# Patient Record
Sex: Male | Born: 1980 | Race: White | Hispanic: No | Marital: Single | Smoking: Current every day smoker
Health system: Southern US, Community
[De-identification: ages and names within clinical notes are randomized; demographics above are authoritative.]

---
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
## 2003-10-01 ENCOUNTER — Inpatient Hospital Stay (HOSPITAL_COMMUNITY): Admission: EM | Admit: 2003-10-01 | Discharge: 2003-10-03 | Payer: Self-pay | Admitting: Emergency Medicine

## 2004-11-16 ENCOUNTER — Emergency Department (HOSPITAL_COMMUNITY): Admission: EM | Admit: 2004-11-16 | Discharge: 2004-11-16 | Payer: Self-pay | Admitting: Emergency Medicine

## 2004-11-29 ENCOUNTER — Emergency Department (HOSPITAL_COMMUNITY): Admission: EM | Admit: 2004-11-29 | Discharge: 2004-11-29 | Payer: Self-pay | Admitting: Emergency Medicine

## 2005-01-04 ENCOUNTER — Emergency Department (HOSPITAL_COMMUNITY): Admission: EM | Admit: 2005-01-04 | Discharge: 2005-01-04 | Payer: Self-pay | Admitting: Emergency Medicine

## 2005-02-24 ENCOUNTER — Emergency Department (HOSPITAL_COMMUNITY): Admission: EM | Admit: 2005-02-24 | Discharge: 2005-02-24 | Payer: Self-pay | Admitting: Emergency Medicine

## 2006-10-17 ENCOUNTER — Emergency Department (HOSPITAL_COMMUNITY): Admission: EM | Admit: 2006-10-17 | Discharge: 2006-10-17 | Payer: Self-pay | Admitting: Emergency Medicine

## 2006-11-05 ENCOUNTER — Emergency Department (HOSPITAL_COMMUNITY): Admission: EM | Admit: 2006-11-05 | Discharge: 2006-11-05 | Payer: Self-pay | Admitting: Emergency Medicine

## 2007-01-30 ENCOUNTER — Emergency Department (HOSPITAL_COMMUNITY): Admission: EM | Admit: 2007-01-30 | Discharge: 2007-01-30 | Payer: Self-pay | Admitting: Emergency Medicine

## 2007-04-22 ENCOUNTER — Emergency Department (HOSPITAL_COMMUNITY): Admission: EM | Admit: 2007-04-22 | Discharge: 2007-04-22 | Payer: Self-pay | Admitting: Emergency Medicine

## 2007-08-05 ENCOUNTER — Emergency Department (HOSPITAL_COMMUNITY): Admission: EM | Admit: 2007-08-05 | Discharge: 2007-08-05 | Payer: Self-pay | Admitting: Emergency Medicine

## 2008-01-05 ENCOUNTER — Emergency Department (HOSPITAL_COMMUNITY): Admission: EM | Admit: 2008-01-05 | Discharge: 2008-01-05 | Payer: Self-pay | Admitting: Emergency Medicine

## 2008-04-14 ENCOUNTER — Emergency Department (HOSPITAL_COMMUNITY): Admission: EM | Admit: 2008-04-14 | Discharge: 2008-04-14 | Payer: Self-pay | Admitting: Emergency Medicine

## 2008-10-11 ENCOUNTER — Emergency Department (HOSPITAL_COMMUNITY): Admission: EM | Admit: 2008-10-11 | Discharge: 2008-10-11 | Payer: Self-pay | Admitting: Emergency Medicine
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
## 2011-01-29 LAB — DIFFERENTIAL
Basophils Absolute: 0.1
Eosinophils Relative: 1
Lymphocytes Relative: 27
Lymphs Abs: 3
Monocytes Absolute: 0.8
Monocytes Relative: 7
Neutrophils Relative %: 64

## 2011-01-29 LAB — CBC
HCT: 47
Hemoglobin: 15.9
MCHC: 33.9
Platelets: 335
RDW: 13

## 2011-02-09 LAB — DIFFERENTIAL
Basophils Absolute: 0 10*3/uL (ref 0.0–0.1)
Eosinophils Absolute: 0 10*3/uL (ref 0.0–0.7)
Eosinophils Relative: 0 % (ref 0–5)
Lymphocytes Relative: 10 % — ABNORMAL LOW (ref 12–46)
Lymphs Abs: 1.3 10*3/uL (ref 0.7–4.0)
Monocytes Absolute: 1.1 10*3/uL — ABNORMAL HIGH (ref 0.1–1.0)
Monocytes Relative: 8 % (ref 3–12)
Neutro Abs: 11.5 10*3/uL — ABNORMAL HIGH (ref 1.7–7.7)

## 2011-02-09 LAB — CBC
Hemoglobin: 16.2 g/dL (ref 13.0–17.0)
MCV: 90.4 fL (ref 78.0–100.0)
RBC: 5.41 MIL/uL (ref 4.22–5.81)
WBC: 13.9 10*3/uL — ABNORMAL HIGH (ref 4.0–10.5)

## 2011-02-09 LAB — URINALYSIS, ROUTINE W REFLEX MICROSCOPIC
Bilirubin Urine: NEGATIVE
Hgb urine dipstick: NEGATIVE
Nitrite: NEGATIVE
Specific Gravity, Urine: 1.025 (ref 1.005–1.030)
pH: 6 (ref 5.0–8.0)

## 2011-02-09 LAB — HEPATIC FUNCTION PANEL: Indirect Bilirubin: 0.8 mg/dL (ref 0.3–0.9)

## 2011-02-09 LAB — BASIC METABOLIC PANEL
Calcium: 9.4 mg/dL (ref 8.4–10.5)
GFR calc non Af Amer: >60 mL/min (ref >60)
Glucose, Bld: 83 mg/dL (ref 70–99)
Potassium: 4.6 mEq/L (ref 3.5–5.1)

## 2011-02-15 LAB — I-STAT 8, (EC8 V) (CONVERTED LAB)
Bicarbonate: 26.2 — ABNORMAL HIGH
Chloride: 102
HCT: 51
Hemoglobin: 17.3 — ABNORMAL HIGH
Operator id: 277751
Potassium: 3.8
Sodium: 139
pCO2, Ven: 44.1 — ABNORMAL LOW

## 2011-02-15 LAB — URINALYSIS, ROUTINE W REFLEX MICROSCOPIC
Glucose, UA: NEGATIVE
Specific Gravity, Urine: 1.025
Urobilinogen, UA: 0.2
pH: 6

## 2011-02-15 LAB — POCT I-STAT CREATININE
Creatinine, Ser: 1.2
Operator id: 277751

## 2015-10-18 ENCOUNTER — Emergency Department (HOSPITAL_COMMUNITY)
Admission: EM | Admit: 2015-10-18 | Discharge: 2015-10-19 | Disposition: A | Discharge status: Home / Self Care | Payer: Self-pay | Attending: Emergency Medicine | Admitting: Emergency Medicine

## 2015-10-18 DIAGNOSIS — S61305A Unspecified open wound of left ring finger with damage to nail, initial encounter: Secondary | ICD-10-CM | POA: Insufficient documentation

## 2015-10-18 DIAGNOSIS — Y990 Civilian activity done for income or pay: Secondary | ICD-10-CM | POA: Insufficient documentation

## 2015-10-18 DIAGNOSIS — W231XXA Caught, crushed, jammed, or pinched between stationary objects, initial encounter: Secondary | ICD-10-CM | POA: Insufficient documentation

## 2015-10-18 DIAGNOSIS — S61309A Unspecified open wound of unspecified finger with damage to nail, initial encounter: Secondary | ICD-10-CM

## 2015-10-18 DIAGNOSIS — Y9389 Activity, other specified: Secondary | ICD-10-CM | POA: Insufficient documentation

## 2015-10-18 DIAGNOSIS — Y929 Unspecified place or not applicable: Secondary | ICD-10-CM | POA: Insufficient documentation

## 2015-10-18 MED ORDER — BUPIVACAINE HCL (PF) 0.25 % IJ SOLN
10.0000 mL | Freq: Once | INTRAMUSCULAR | Status: AC
  Administered 2015-10-18: 10 mL
  Filled 2015-10-18: qty 30

## 2015-10-18 MED ORDER — TETANUS-DIPHTH-ACELL PERTUSSIS 5-2.5-18.5 LF-MCG/0.5 IM SUSP
0.5000 mL | Freq: Once | INTRAMUSCULAR | Status: AC
  Administered 2015-10-18: 0.5 mL via INTRAMUSCULAR
  Filled 2015-10-18: qty 0.5

## 2015-10-18 NOTE — ED Notes (Signed)
Pt injured his left ring finger last night working on a motorcycle
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
## 2015-10-19 MED ORDER — CEPHALEXIN 500 MG PO CAPS: 500.0000 mg | ORAL_CAPSULE | Freq: Four times a day (QID) | ORAL | Status: DC

## 2015-10-19 NOTE — Discharge Instructions (Signed)
Fingernail or Toenail Removal  Fingernail or toenail removal is a surgical procedure to take off a nail from your finger or your toe. You may need to have a fingernail or toenail removed if it has an abnormal shape (deformity) or if it is severely injured. A fingernail or toenail may also be removed due to a bacterial infection, a severe ingrown toenail, or a fungal infection that has failed treatment with antifungal medicines.  LET YOUR HEALTH CARE PROVIDER KNOW ABOUT:   Any allergies you have.   All medicines you are taking, including vitamins, herbs, eye drops, creams, and over-the-counter medicines.   Previous problems you or members of your family have had with the use of anesthetics.   Any blood disorders you have.   Previous surgeries you have had.   Any medical conditions you may have.  RISKS AND COMPLICATIONS  Generally, this is a safe procedure. However, problems may occur, including:   Pain.   Bleeding.   Infection.   Regrowth of a deformed nail.  BEFORE THE PROCEDURE   Ask your health care provider about changing or stopping your regular medicines. This is especially important if you are taking diabetes medicines or blood thinners.   Follow instructions from your health care provider about eating or drinking restrictions.   Plan to have someone take you home after the procedure.  PROCEDURE   An IV tube will be inserted into one of your veins.   You will be given one or more of the following:    A medicine that helps you relax (sedative).    A medicine that numbs the area (local anesthetic).   After your toe or finger is numb, your health care provider will insert a blunt instrument under your nail to lift it up.   In some cases, your health care provider may also make a cut (incision) in your nail.   After your nail is lifted away from your toe or finger, your health care provider will detach it from your nail bed.   A germ-killing bandage (antiseptic dressing) will be put on your toe  or finger.  The procedure may vary among health care providers and hospitals.  AFTER THE PROCEDURE   Your blood pressure, heart rate, breathing rate, and blood oxygen level will be monitored often until the medicines you were given have worn off.   It is common to have some pain after nail removal. You will be given pain medicine as needed.   You may be given a prescription for pain medicine and antibiotic medicine.   If you had a toenail removed, you will be given a surgical shoe to wear while you recover.   If you had a fingernail removed, you may be given a finger splint to wear while you recover.     This information is not intended to replace advice given to you by your health care provider. Make sure you discuss any questions you have with your health care provider.     Document Released: 01/20/2003 Document Revised: 09/07/2014 Document Reviewed: 04/21/2014  Elsevier Interactive Patient Education 2016 [employer]

## 2017-05-22 ENCOUNTER — Encounter (HOSPITAL_COMMUNITY): Payer: Self-pay | Admitting: *Deleted

## 2017-05-22 ENCOUNTER — Other Ambulatory Visit: Payer: Self-pay

## 2017-05-22 ENCOUNTER — Emergency Department (HOSPITAL_COMMUNITY): Payer: Self-pay

## 2017-05-22 ENCOUNTER — Emergency Department (HOSPITAL_COMMUNITY)
Admission: EM | Admit: 2017-05-22 | Discharge: 2017-05-22 | Disposition: A | Discharge status: Home / Self Care | Payer: Self-pay | Attending: Emergency Medicine | Admitting: Emergency Medicine

## 2017-05-22 DIAGNOSIS — Z79899 Other long term (current) drug therapy: Secondary | ICD-10-CM | POA: Insufficient documentation

## 2017-05-22 DIAGNOSIS — R002 Palpitations: Secondary | ICD-10-CM | POA: Insufficient documentation

## 2017-05-22 DIAGNOSIS — F17210 Nicotine dependence, cigarettes, uncomplicated: Secondary | ICD-10-CM | POA: Insufficient documentation

## 2017-05-22 LAB — BASIC METABOLIC PANEL
ANION GAP: 12 (ref 5–15)
BUN: <5 mg/dL — ABNORMAL LOW (ref 6–20)
CO2: 21 mmol/L — AB (ref 22–32)
CREATININE: 1.01 mg/dL (ref 0.61–1.24)
Calcium: 9.2 mg/dL (ref 8.9–10.3)
Chloride: 101 mmol/L (ref 101–111)
GLUCOSE: 94 mg/dL (ref 65–99)
Potassium: 3.3 mmol/L — ABNORMAL LOW (ref 3.5–5.1)
Sodium: 134 mmol/L — ABNORMAL LOW (ref 135–145)

## 2017-05-22 LAB — CBC
HCT: 49.4 % (ref 39.0–52.0)
HEMOGLOBIN: 17 g/dL (ref 13.0–17.0)
MCH: 31.8 pg (ref 26.0–34.0)
MCHC: 34.4 g/dL (ref 30.0–36.0)
MCV: 92.5 fL (ref 78.0–100.0)
Platelets: 354 10*3/uL (ref 150–400)
RBC: 5.34 MIL/uL (ref 4.22–5.81)
RDW: 14.5 % (ref 11.5–15.5)
WBC: 8.6 10*3/uL (ref 4.0–10.5)

## 2017-05-22 LAB — I-STAT TROPONIN, ED
TROPONIN I, POC: 0 ng/mL (ref 0.00–0.08)
Troponin i, poc: 0 ng/mL (ref 0.00–0.08)

## 2017-05-22 NOTE — Discharge Instructions (Signed)
Make an appointment to follow-up with cardiology.  Return for any new or worse symptoms or if the heart is in palpitations for more than an hour.  Today's workup without any acute findings.

## 2017-05-22 NOTE — ED Triage Notes (Signed)
Pt reports onset last night of intermittent palpitations with sob. Reports hx of same but never had a diagnosis made and does not take any meds for it.

## 2017-05-22 NOTE — ED Provider Notes (Signed)
[facility] Provider Note   CSN: [id number] Arrival date & time: 05/22/17  [id number]     History   Chief Complaint Chief Complaint  Patient presents with  . Palpitations    HPI John Mata is a 37 y.o. male.  Patient presents with palpitations heart pounding that started at 4 in the morning.  Patient had a history of palpitations in the past and at one time was monitored without any significant findings.  Associated with shortness of breath and a tightness feeling in the chest.  Patient now feels fine.      History reviewed. No pertinent past medical history.  There are no active problems to display for this patient.   History reviewed. No pertinent surgical history.     Home Medications    Prior to Admission medications   Medication Sig Start Date End Date Taking? Authorizing Provider  fexofenadine (ALLEGRA) 180 MG tablet Take 180 mg by mouth daily as needed for allergies or rhinitis.   Yes [provider]  Phenyleph-Doxylamine-DM-APAP (NYQUIL SEVERE COLD/FLU) 5-6.25-10-325 MG/15ML LIQD Take 15 mLs by mouth as needed (congestion).   Yes [provider]  Phenylephrine-APAP-Guaifenesin (MUCINEX FAST-MAX PO) Take 1 tablet by mouth as needed (congestion).   Yes [provider]  Pseudoephedrine-APAP-DM (DAYQUIL PO) Take 2 tablets by mouth as needed (cold symptoms).   Yes [provider]    Family History History reviewed. No pertinent family history.  Social History Social History   Tobacco Use  . Smoking status: Current Every Day Smoker  Substance Use Topics  . Alcohol use: No    Frequency: Never  . Drug use: No     Allergies   Patient has no known allergies.   Review of Systems Review of Systems  Constitutional: Negative for fever.  HENT: Negative for congestion.   Eyes: Negative for visual disturbance.  Respiratory: Negative for shortness of breath.   Cardiovascular:  Positive for chest pain and palpitations. Negative for leg swelling.  Gastrointestinal: Negative for abdominal pain.  Musculoskeletal: Negative for back pain.  Skin: Negative for rash.  Neurological: Negative for headaches.  Hematological: Does not bruise/bleed easily.  Psychiatric/Behavioral: Negative for confusion.     Physical Exam Updated Vital Signs BP (!) 118/106   Pulse 73   Temp 97.9 F (36.6 C) (Oral)   Resp 20   SpO2 95%   Physical Exam  Constitutional: He is oriented to person, place, and time. He appears well-developed and well-nourished. No distress.  HENT:  Head: Normocephalic and atraumatic.  Mouth/Throat: Oropharynx is clear and moist.  Eyes: EOM are normal. Pupils are equal, round, and reactive to light.  Neck: Normal range of motion. Neck supple.  Cardiovascular: Normal rate, regular rhythm and normal heart sounds.  Pulmonary/Chest: Effort normal and breath sounds normal. He exhibits no tenderness.  Abdominal: Soft. Bowel sounds are normal. There is no tenderness.  Musculoskeletal: Normal range of motion. He exhibits no edema.  Neurological: He is alert and oriented to person, place, and time. No cranial nerve deficit or sensory deficit. He exhibits normal muscle tone. Coordination normal.  Skin: Skin is warm.  Nursing note and vitals reviewed.    ED Treatments / Results  Labs (all labs ordered are listed, but only abnormal results are displayed) Labs Reviewed  BASIC METABOLIC PANEL - Abnormal; Notable for the following components:      Result Value   Sodium 134 (*)    Potassium 3.3 (*)  CO2 21 (*)    BUN <5 (*)    All other components within normal limits  CBC  I-STAT TROPONIN, ED  I-STAT TROPONIN, ED    EKG  EKG Interpretation  Date/Time:  Wednesday May 22 2017 08:18:28 EST Ventricular Rate:  84 PR Interval:  156 QRS Duration: 96 QT Interval:  356 QTC Calculation: 420 R Axis:   77 Text Interpretation:  Normal sinus rhythm Normal  ECG Interpretation limited secondary to artifact Confirmed by Vanetta Mulders [phone]) on 05/22/2017 9:16:05 AM       Radiology Dg Chest 2 View  Result Date: 05/22/2017 CLINICAL DATA:  Tachycardia, palpitations, discomfort since last night, some shortness of breath, visual impairment when other symptoms occur, diaphoresis, history smoking EXAM: CHEST  2 VIEW COMPARISON:  None FINDINGS: Normal heart size, mediastinal contours, and pulmonary vascularity. Lungs clear. No pleural effusion or pneumothorax. Bones unremarkable. IMPRESSION: Normal exam. Electronically Signed   By: Ulyses Southward M.D.   On: 05/22/2017 09:09    Procedures Procedures (including critical care time)  Medications Ordered in ED Medications - No data to display   Initial Impression / Assessment and Plan / ED Course  I have reviewed the triage vital signs and the nursing notes.  Pertinent labs & imaging results that were available during my care of the patient were reviewed by me and considered in my medical decision making (see chart for details).     No evidence of any arrhythmias on heart monitoring here.  EKG without any acute findings.  Troponins x2 were negative.  These were done because of the tightness in the chest.  Patient really without any concerns for pulmonary embolus.  No shortness of breath.  Not tachycardic.  Oxygen saturations in the upper 90s.  Patient will be referred to cardiology for further evaluation they may want to do cardiac monitoring again.  Patient will return for any new or worse symptoms or any recurrent palpitations lasting 40 minutes or longer.  Final Clinical Impressions(s) / ED Diagnoses   Final diagnoses:  Palpitations    ED Discharge Orders    None       Vanetta Mulders, MD 05/22/17 [phone]

## 2019-04-27 IMAGING — DX DG CHEST 2V
2 series · 2 of 2 positions shown · non-contrast
Comparison: None

CLINICAL DATA: Tachycardia, palpitations, discomfort since last
night, some shortness of breath, visual impairment when other
symptoms occur, diaphoresis, history smoking

EXAM:
CHEST  2 VIEW

[chest pa]
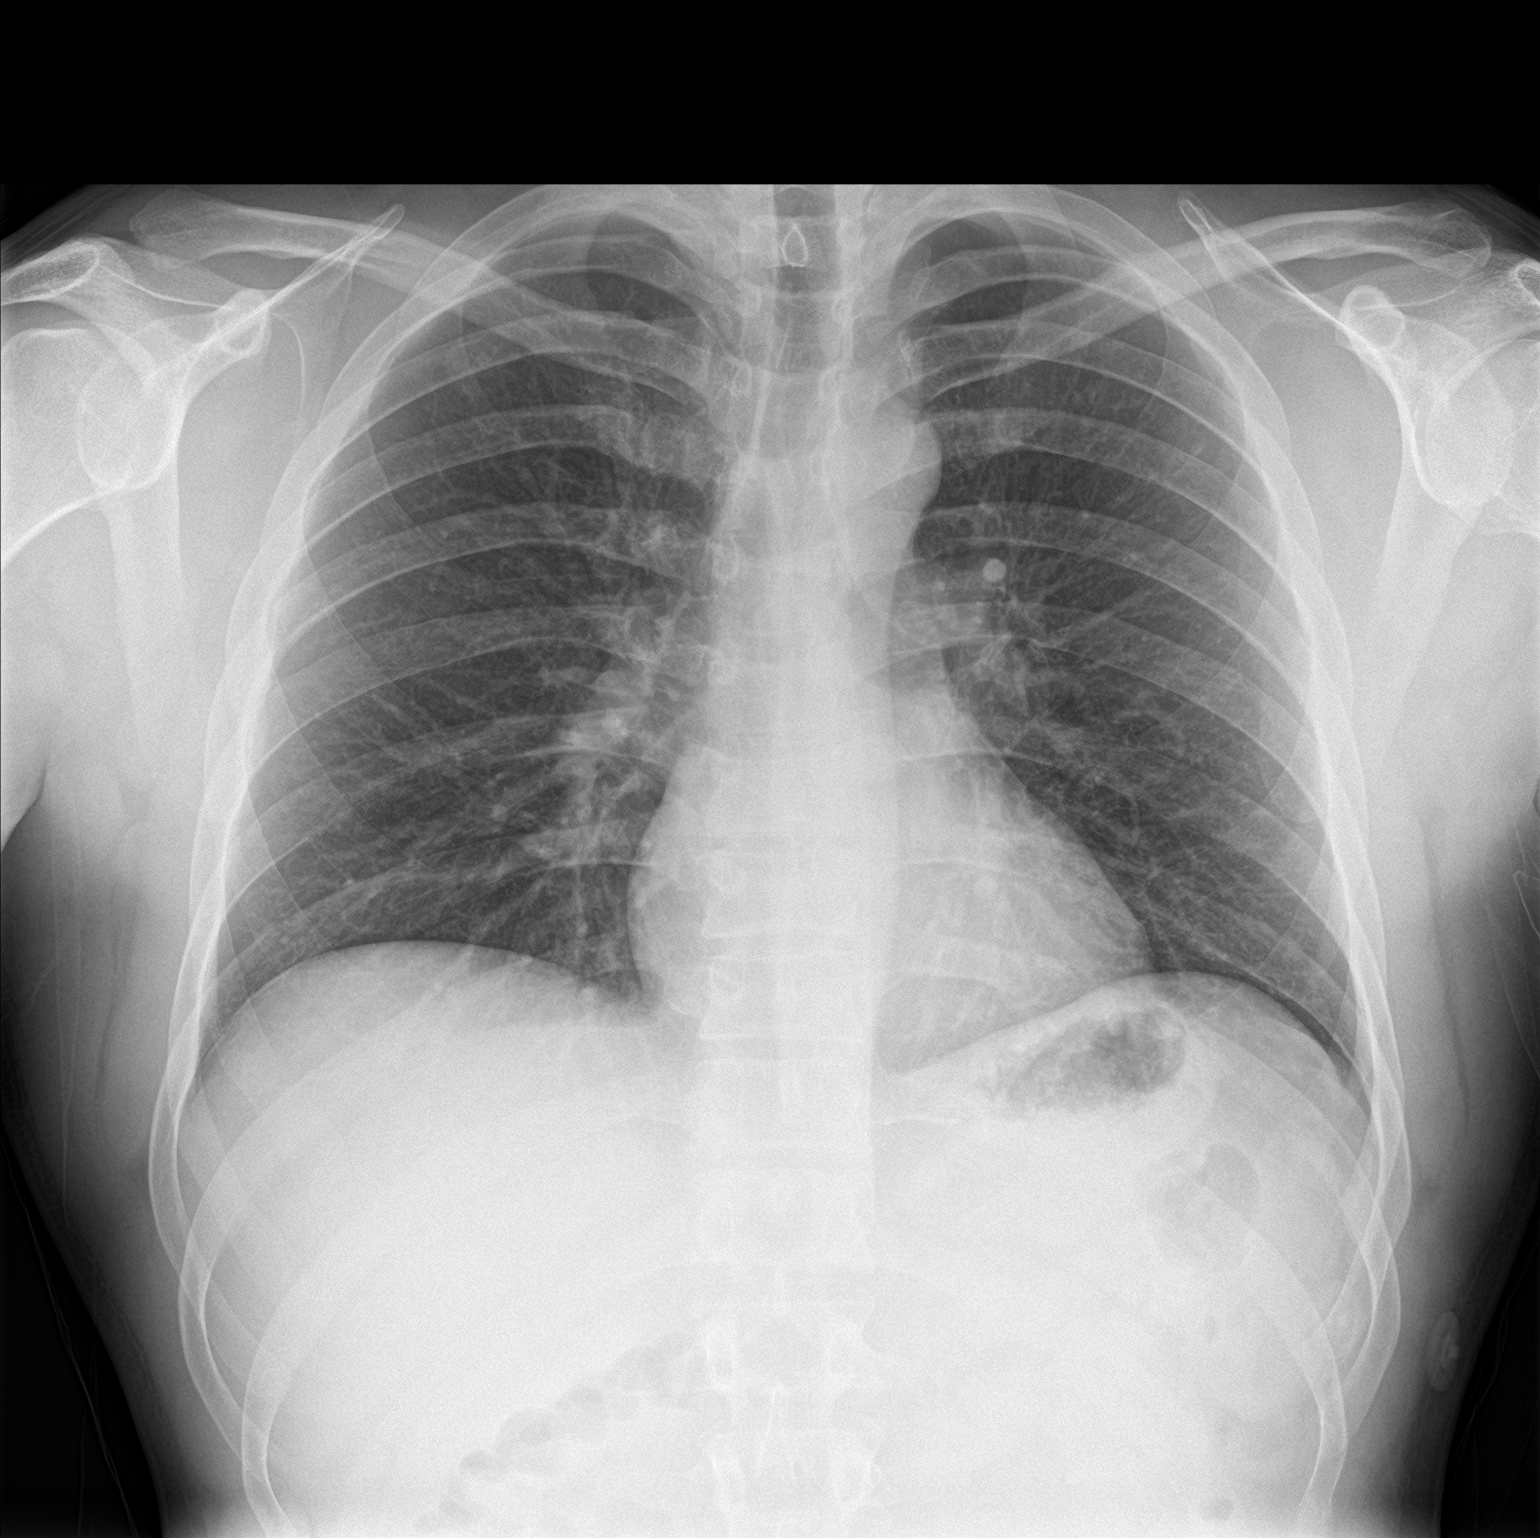

[chest lat]
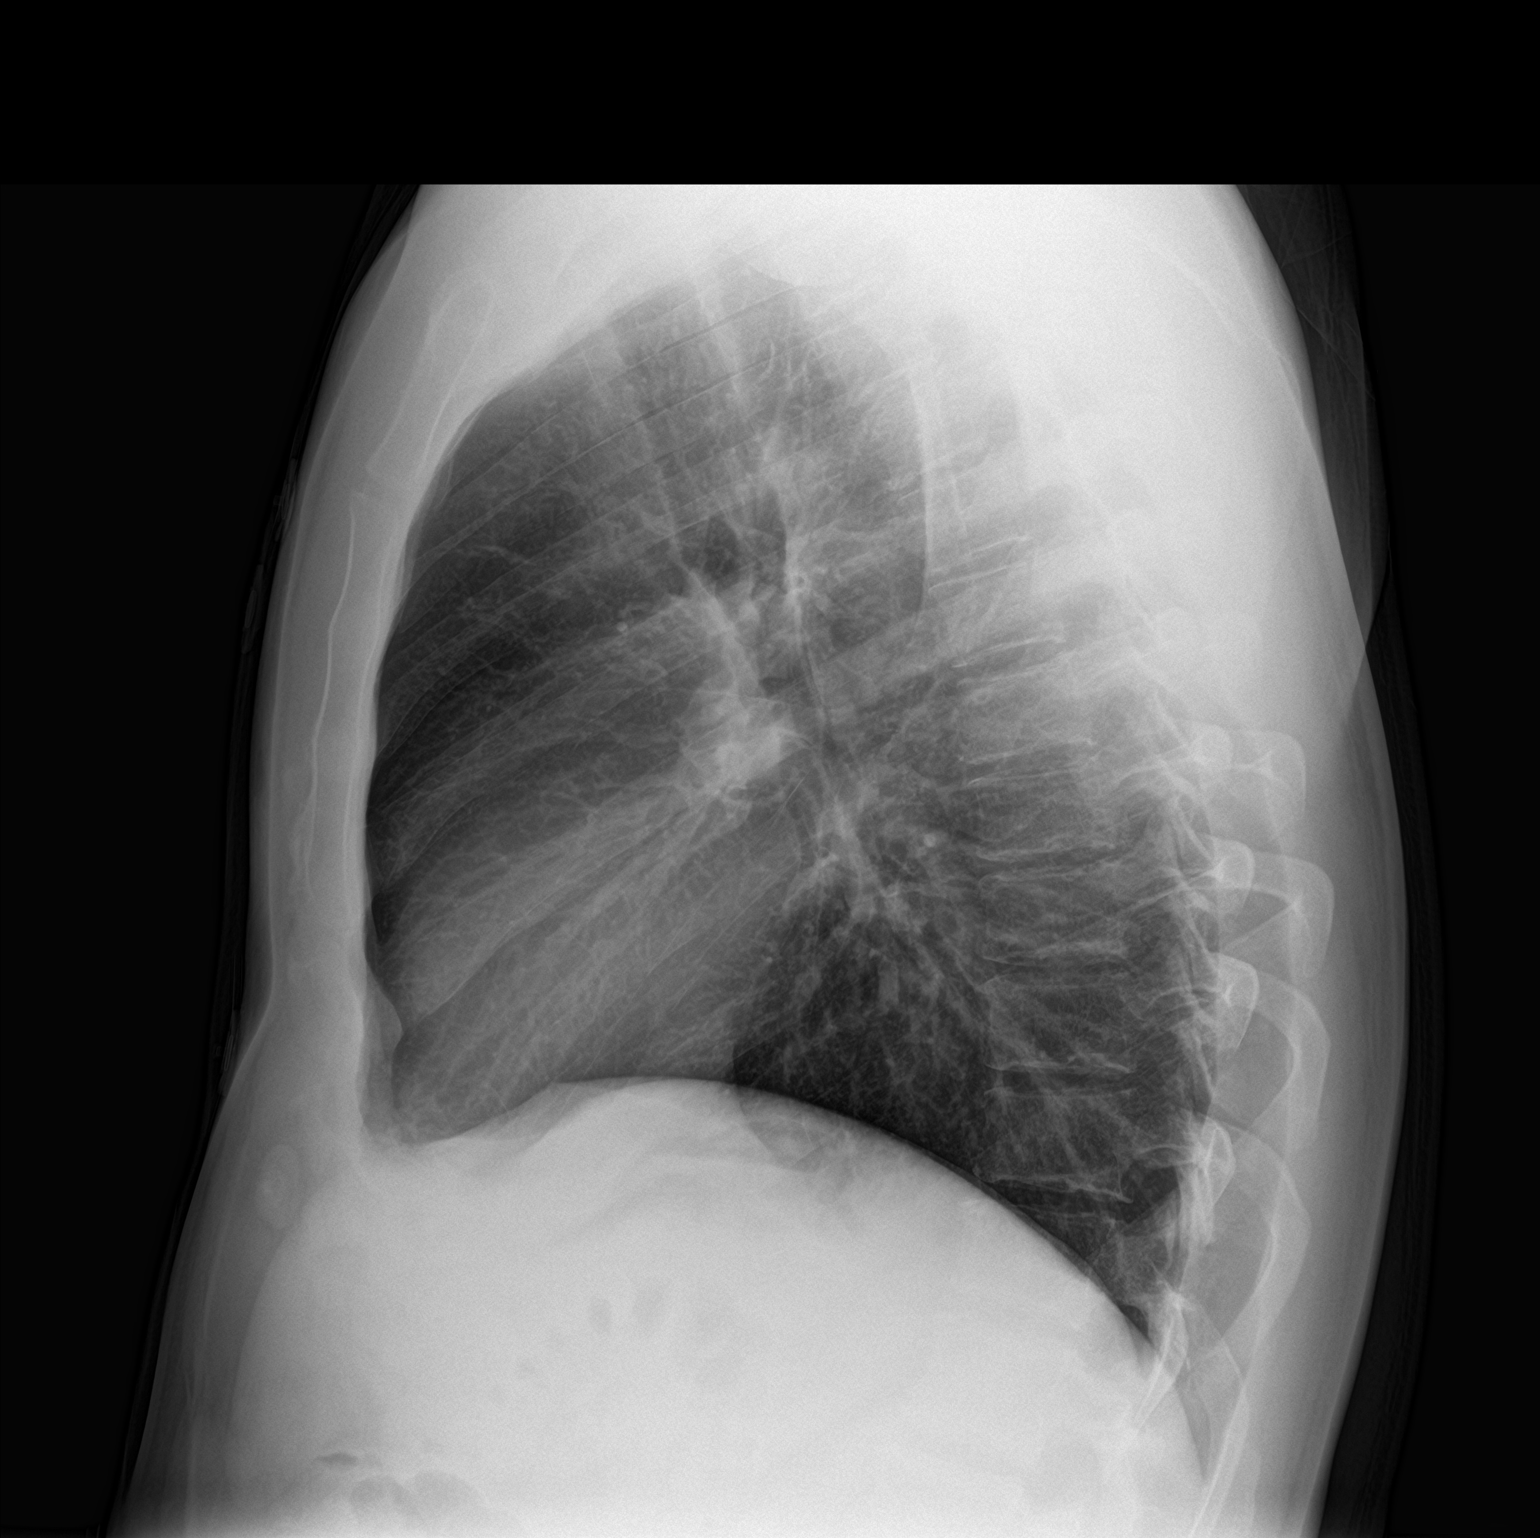

[2 of 2 positions shown; findings below may reference images not displayed]

FINDINGS: Normal heart size, mediastinal contours, and pulmonary vascularity.

Lungs clear.

No pleural effusion or pneumothorax.

Bones unremarkable.
IMPRESSION: Normal exam.

## 2022-10-12 ENCOUNTER — Emergency Department (HOSPITAL_BASED_OUTPATIENT_CLINIC_OR_DEPARTMENT_OTHER)
Admission: EM | Admit: 2022-10-12 | Discharge: 2022-10-12 | Disposition: A | Discharge status: Home / Self Care | Payer: Self-pay | Attending: Emergency Medicine | Admitting: Emergency Medicine

## 2022-10-12 ENCOUNTER — Other Ambulatory Visit (HOSPITAL_BASED_OUTPATIENT_CLINIC_OR_DEPARTMENT_OTHER): Payer: Self-pay

## 2022-10-12 ENCOUNTER — Emergency Department (HOSPITAL_BASED_OUTPATIENT_CLINIC_OR_DEPARTMENT_OTHER): Payer: Self-pay

## 2022-10-12 ENCOUNTER — Encounter (HOSPITAL_BASED_OUTPATIENT_CLINIC_OR_DEPARTMENT_OTHER): Payer: Self-pay | Admitting: Emergency Medicine

## 2022-10-12 ENCOUNTER — Other Ambulatory Visit: Payer: Self-pay

## 2022-10-12 DIAGNOSIS — M79645 Pain in left finger(s): Secondary | ICD-10-CM | POA: Insufficient documentation

## 2022-10-12 DIAGNOSIS — Z23 Encounter for immunization: Secondary | ICD-10-CM | POA: Insufficient documentation

## 2022-10-12 MED ORDER — TETANUS-DIPHTH-ACELL PERTUSSIS 5-2.5-18.5 LF-MCG/0.5 IM SUSY
0.5000 mL | PREFILLED_SYRINGE | Freq: Once | INTRAMUSCULAR | Status: AC
Start: 2022-10-12 — End: 2022-10-12
  Administered 2022-10-12: 0.5 mL via INTRAMUSCULAR
  Filled 2022-10-12: qty 0.5

## 2022-10-12 MED ORDER — CEPHALEXIN 500 MG PO CAPS
500.0000 mg | ORAL_CAPSULE | Freq: Four times a day (QID) | ORAL | 0 refills | Status: DC
  Filled 2022-10-12: qty 20, 5d supply, fill #0
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
## 2022-10-12 NOTE — Discharge Instructions (Signed)
You have been evaluated for your symptoms.  Pain and swelling of your left thumb may be due to an overlying skin infection.  Please take antibiotic as prescribed.  Take over-the-counter Tylenol or ibuprofen as needed for pain.  If you notice no improvement after 2 days, you may follow-up with hand specialist for further care.

## 2022-10-12 NOTE — ED Triage Notes (Signed)
Left thumb pain for 1 day since last night swelling and painful he states  may have burned it but not sure unk last tetanus

## 2022-10-15 ENCOUNTER — Emergency Department (HOSPITAL_BASED_OUTPATIENT_CLINIC_OR_DEPARTMENT_OTHER)
Admission: EM | Admit: 2022-10-15 | Discharge: 2022-10-15 | Disposition: A | Discharge status: Home / Self Care | Payer: Self-pay | Attending: Emergency Medicine | Admitting: Emergency Medicine

## 2022-10-15 ENCOUNTER — Other Ambulatory Visit: Payer: Self-pay

## 2022-10-15 ENCOUNTER — Encounter (HOSPITAL_BASED_OUTPATIENT_CLINIC_OR_DEPARTMENT_OTHER): Payer: Self-pay | Admitting: Emergency Medicine

## 2022-10-15 DIAGNOSIS — T23012A Burn of unspecified degree of left thumb (nail), initial encounter: Secondary | ICD-10-CM | POA: Insufficient documentation

## 2022-10-15 DIAGNOSIS — T300 Burn of unspecified body region, unspecified degree: Secondary | ICD-10-CM

## 2022-10-15 DIAGNOSIS — X19XXXA Contact with other heat and hot substances, initial encounter: Secondary | ICD-10-CM | POA: Insufficient documentation

## 2022-10-15 MED ORDER — IBUPROFEN 800 MG PO TABS: 800.0000 mg | ORAL_TABLET | Freq: Three times a day (TID) | ORAL | 0 refills | Status: DC

## 2022-10-15 MED ORDER — LIDOCAINE HCL (PF) 1 % IJ SOLN
10.0000 mL | Freq: Once | INTRAMUSCULAR | Status: AC
  Administered 2022-10-15: 10 mL
  Filled 2022-10-15: qty 10

## 2022-10-15 MED ORDER — DOXYCYCLINE HYCLATE 100 MG PO CAPS: 100.0000 mg | ORAL_CAPSULE | Freq: Two times a day (BID) | ORAL | 0 refills | Status: DC

## 2022-10-15 NOTE — Discharge Instructions (Addendum)
Please take your antibiotics as prescribed. Take ibuprofen 800 mg 3 times a day for pain. I recommend close follow-up with a hand surgeon for reevaluation.  Please do not hesitate to return to emergency department if worrisome signs symptoms we discussed become apparent.
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
## 2022-10-15 NOTE — ED Notes (Signed)
Discharge instructions reviewed with patient. Patient questions answered and opportunity for education reviewed. Patient voices understanding of discharge instructions with no further questions. Patient ambulatory with steady gait to lobby.  
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
## 2022-10-19 ENCOUNTER — Other Ambulatory Visit (HOSPITAL_BASED_OUTPATIENT_CLINIC_OR_DEPARTMENT_OTHER): Payer: Self-pay

## 2022-10-29 ENCOUNTER — Other Ambulatory Visit (HOSPITAL_BASED_OUTPATIENT_CLINIC_OR_DEPARTMENT_OTHER): Payer: Self-pay

## 2023-04-20 ENCOUNTER — Ambulatory Visit (HOSPITAL_COMMUNITY)
Admission: EM | Admit: 2023-04-20 | Discharge: 2023-04-20 | Disposition: A | Discharge status: Home / Self Care | Payer: No Payment, Other | Attending: Urology | Admitting: Urology

## 2023-04-20 DIAGNOSIS — F4321 Adjustment disorder with depressed mood: Secondary | ICD-10-CM | POA: Insufficient documentation

## 2023-04-20 DIAGNOSIS — F1910 Other psychoactive substance abuse, uncomplicated: Secondary | ICD-10-CM | POA: Insufficient documentation

## 2023-04-20 NOTE — ED Provider Notes (Incomplete)
[facility] Medical Screening Exam  Patient Name: John Mata MRN: [id number] Date of Evaluation: 04/20/23 Chief Complaint:   Diagnosis:  Final diagnoses:  Adjustment disorder with depressed mood    History of Present illness: John Mata is a 42 y.o. male. ***  Flowsheet Row ED from 04/20/2023 in [facility] ED from 10/15/2022 in [facility] Emergency Department at [facility] ED from 10/12/2022 in [facility] Emergency Department at [facility]  C-SSRS RISK CATEGORY No Risk No Risk No Risk       Psychiatric Specialty Exam  Presentation  General Appearance:Appropriate for Environment  Eye Contact:Good  Speech:Clear and Coherent  Speech Volume:Normal  Handedness:Right   Mood and Affect  Mood: Euthymic  Affect: Appropriate   Thought Process  Thought Processes: Coherent  Descriptions of Associations:Intact  Orientation:Full (Time, Place and Person)  Thought Content:WDL    Hallucinations:None  Ideas of Reference:None  Suicidal Thoughts:No  Homicidal Thoughts:No   Sensorium  Memory: Immediate Good; Recent Good; Remote Good  Judgment: Good  Insight: Good   Executive Functions  Concentration: Good  Attention Span: Good  Recall: Good  Fund of Knowledge: Good  Language: Good   Psychomotor Activity  Psychomotor Activity: Normal   Assets  Assets: Communication Skills; Desire for Improvement; Social Support   Sleep  Sleep: Good  Number of hours:  7   Physical Exam: Physical Exam ROS Blood pressure (!) 144/109, pulse (!) 102, temperature 97.9 F (36.6 C), temperature source Oral, resp. rate 18, SpO2 97%. There is no height or weight on file to calculate BMI.  Musculoskeletal: Strength & Muscle Tone: {desc; muscle tone:32375} Gait & Station: {PE GAIT ED NATL:22525} Patient leans: {Patient Leans:21022755}   [facility] MSE Discharge Disposition for  Follow up and Recommendations: {[facility] MSE Recommendations:24277}   Maricela Bo, NP 04/20/2023, 9:56 PM

## 2023-04-20 NOTE — ED Notes (Signed)
[employer] called for transport back home

## 2023-04-20 NOTE — ED Notes (Addendum)
Pt refused vitals and stated he did not want [employer] to give him a ride he stated [place] and would rather walk and he wanted to leave at this moment

## 2023-04-20 NOTE — Discharge Instructions (Addendum)
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
## 2023-04-21 NOTE — ED Provider Notes (Incomplete)
Behavioral [facility] Urgent [facility] Medical Screening Exam  Patient Name: John Mata MRN: [id number] Date of Evaluation: 04/20/23 Chief Complaint:   Diagnosis:  Final diagnoses:  Adjustment disorder with depressed mood    History of Present illness: John Mata is a 42 y.o. male with self-reported history of alcohol abuse and ADHD (diagnosed as a child not currently on any medication).  Patient presented voluntarily to [facility] via law enforcement for mental health evaluation.  This nurse practitioner evaluated patient face-to-face, and reviewed patient's chart, and consulted with John Mata on 04/21/23. On assessment, patient reports that he was home resting and police came to his home to bring him here for a mental health evaluation.  Patient reports that his father contacted police due to concerns about his mental health. Patient admitted to texting his father "I am sorry am not better, I'm not at my potential. I wish I was a better son."  Patient states that his father became concerned and contacted law enforcement due to his brother committing suicide in 60.  Patient denies suicidal ideation and stated that he spent the text message due to his frustration with his life. He says he struggles with alcohol abuse on and off.  He reports being sober for approximately 7 months but recently started drinking because he felt his body was not functioning well without the alcohol. He reports that he thinks about his dead brother a lot during the holiday times but denies feeling depressed. He admitted to experiencing depressives symptoms during TTS assessment however he denies these symptoms during assessment with this provider. He denies SI/HI/AVH. He is contracting for safety and gave verbal consent to contact his father, John Mata) and girlfriend John Mata.  Collateral: multiple attempts made via the above number to contact patient's girlfriend, John Mata but no  answer. This provider left HIPAA compliant V.mail with call back information.   Per pt's father, John Mata: John. John Mata reports that he attempted to contact patient multiple times today but patient didn't answer. He says he became concern and called law enforcement. John Mata that patient's older brother committed suicide 30 years ago. He says he "wanted to air on the side of caution." He denies patient making any threats to harm himself or others. He denies behavioral changes in patient.     Flowsheet Row ED from 04/20/2023 in [facility] ED from 10/15/2022 in [facility] Emergency Department at [facility] ED from 10/12/2022 in [facility] Emergency Department at [facility]  C-SSRS RISK CATEGORY No Risk No Risk No Risk       Psychiatric Specialty Exam  Presentation  General Appearance:Appropriate for Environment  Eye Contact:Good  Speech:Clear and Coherent  Speech Volume:Normal  Handedness:Right   Mood and Affect  Mood: Euthymic  Affect: Appropriate   Thought Process  Thought Processes: Coherent  Descriptions of Associations:Intact  Orientation:Full (Time, Place and Person)  Thought Content:WDL    Hallucinations:None  Ideas of Reference:None  Suicidal Thoughts:No  Homicidal Thoughts:No   Sensorium  Memory: Immediate Good; Recent Good; Remote Good  Judgment: Good  Insight: Good   Executive Functions  Concentration: Good  Attention Span: Good  Recall: Good  Fund of Knowledge: Good  Language: Good   Psychomotor Activity  Psychomotor Activity: Normal   Assets  Assets: Communication Skills; Desire for Improvement; Social Support   Sleep  Sleep: Good  Number of hours:  7   Physical Exam: Physical Exam ROS Blood pressure Marland Kitchen)  144/109, pulse (!) 102, temperature 97.9 F (36.6 C), temperature source Oral, resp. rate 18, SpO2 97%. There is no height or weight on file to  calculate BMI.  Musculoskeletal: Strength & Muscle Tone: {desc; muscle tone:32375} Gait & Station: {PE GAIT ED NATL:22525} Patient leans: {Patient Leans:21022755}   [facility] MSE Discharge Disposition for Follow up and Recommendations: {[facility] MSE Recommendations:24277}   John Bo, NP 04/20/2023, 9:56 PM

## 2023-11-22 ENCOUNTER — Encounter: Payer: Self-pay | Admitting: Advanced Practice Midwife

## 2023-12-26 ENCOUNTER — Encounter (HOSPITAL_COMMUNITY): Payer: Self-pay | Admitting: Emergency Medicine

## 2023-12-26 ENCOUNTER — Other Ambulatory Visit: Payer: Self-pay

## 2023-12-26 ENCOUNTER — Emergency Department (HOSPITAL_COMMUNITY)
Admission: EM | Admit: 2023-12-26 | Discharge: 2023-12-26 | Disposition: A | Discharge status: Home / Self Care | Payer: Self-pay | Attending: Emergency Medicine | Admitting: Emergency Medicine

## 2023-12-26 ENCOUNTER — Emergency Department (HOSPITAL_COMMUNITY): Payer: Self-pay

## 2023-12-26 DIAGNOSIS — R42 Dizziness and giddiness: Secondary | ICD-10-CM | POA: Insufficient documentation

## 2023-12-26 DIAGNOSIS — R112 Nausea with vomiting, unspecified: Secondary | ICD-10-CM | POA: Insufficient documentation

## 2023-12-26 DIAGNOSIS — R202 Paresthesia of skin: Secondary | ICD-10-CM | POA: Insufficient documentation

## 2023-12-26 DIAGNOSIS — R079 Chest pain, unspecified: Secondary | ICD-10-CM

## 2023-12-26 DIAGNOSIS — R0602 Shortness of breath: Secondary | ICD-10-CM | POA: Insufficient documentation

## 2023-12-26 DIAGNOSIS — R0789 Other chest pain: Secondary | ICD-10-CM | POA: Insufficient documentation

## 2023-12-26 DIAGNOSIS — R059 Cough, unspecified: Secondary | ICD-10-CM | POA: Insufficient documentation

## 2023-12-26 LAB — BASIC METABOLIC PANEL WITH GFR
Anion gap: 13 (ref 5–15)
BUN: <5 mg/dL — ABNORMAL LOW (ref 6–20)
CO2: 24 mmol/L (ref 22–32)
Calcium: 9.5 mg/dL (ref 8.9–10.3)
Chloride: 104 mmol/L (ref 98–111)
Creatinine, Ser: 0.88 mg/dL (ref 0.61–1.24)
GFR, Estimated: >60 mL/min (ref >60)
Glucose, Bld: 110 mg/dL — ABNORMAL HIGH (ref 70–99)
Potassium: 3.2 mmol/L — ABNORMAL LOW (ref 3.5–5.1)
Sodium: 141 mmol/L (ref 135–145)

## 2023-12-26 LAB — CBC
HCT: 46.6 % (ref 39.0–52.0)
Hemoglobin: 16.2 g/dL (ref 13.0–17.0)
MCH: 32.8 pg (ref 26.0–34.0)
MCHC: 34.8 g/dL (ref 30.0–36.0)
MCV: 94.3 fL (ref 80.0–100.0)
Platelets: 396 K/uL (ref 150–400)
RBC: 4.94 MIL/uL (ref 4.22–5.81)
RDW: 14.3 % (ref 11.5–15.5)
WBC: 8.6 K/uL (ref 4.0–10.5)
nRBC: 0 % (ref 0.0–0.2)

## 2023-12-26 LAB — TROPONIN I (HIGH SENSITIVITY)
Troponin I (High Sensitivity): 4 ng/L (ref <18)
Troponin I (High Sensitivity): 3 ng/L (ref <18)

## 2023-12-26 MED ORDER — POTASSIUM CHLORIDE CRYS ER 20 MEQ PO TBCR
40.0000 meq | EXTENDED_RELEASE_TABLET | Freq: Once | ORAL | Status: AC
  Administered 2023-12-26: 40 meq via ORAL
  Filled 2023-12-26: qty 2

## 2023-12-26 NOTE — Discharge Instructions (Addendum)
 Your workup today was overall reassuring. We have placed a cardiology referral for you to further investigate your symptoms. If you have not heard from the Cardiology office within the next 72 hours please call [phone].   Please consider following up at the [facility] center as well for routine primary care.

## 2023-12-26 NOTE — ED Triage Notes (Signed)
 Pt reports left sided chest tightness for a few weeks. Also intermittent cough and SOB. Pt also worried about dental issues. Ankle swelling.

## 2023-12-26 NOTE — ED Provider Notes (Signed)
 [facility] Provider Note   CSN: [id number] Arrival date & time: 12/26/23  9092     Patient presents with: Chest Pain   John Mata is a 43 y.o. male.  -Chest pain ongoing for a month, with 2-3 episodes per week -Describes chest pain as tightness of L lateral chest, spreads across chest and down L arm -Also describes fluttering of heart -Numbness and tingling down L arm  -SOB, lightheadedness, blurry vision, sweating during episodes -Also weeks of nausea and vomiting almost daily, reports intermittent bloody diarrhea  -Also reports chronic ringing in ears   Chest Pain Associated symptoms: cough, dizziness, nausea, numbness, shortness of breath, vomiting and weakness       Prior to Admission medications   Medication Sig Start Date End Date Taking? Authorizing Provider  naproxen sodium (ALEVE) 220 MG tablet Take 220 mg by mouth daily as needed (pain).   Yes [provider]    Allergies: Patient has no known allergies.    Review of Systems  Respiratory:  Positive for cough, chest tightness and shortness of breath.   Cardiovascular:  Positive for chest pain.  Gastrointestinal:  Positive for nausea and vomiting.  Neurological:  Positive for dizziness, weakness, light-headedness and numbness.    Updated Vital Signs BP (!) 127/94 (BP Location: Right Arm)   Pulse 85   Temp 98.1 F (36.7 C) (Oral)   Resp 13   Ht 6' 1 (1.854 m)   Wt 79 kg   SpO2 99%   BMI 22.98 kg/m   Physical Exam Constitutional:      General: He is not in acute distress. Cardiovascular:     Rate and Rhythm: Normal rate and regular rhythm.     Heart sounds: Normal heart sounds.  Pulmonary:     Effort: Pulmonary effort is normal. No tachypnea or respiratory distress.     Breath sounds: Normal breath sounds.  Chest:     Chest wall: Tenderness (L lateral chest wall) present.  Abdominal:     Palpations: Abdomen is soft.     Tenderness:  There is no abdominal tenderness. There is no guarding.  Musculoskeletal:        General: Normal range of motion.  Neurological:     General: No focal deficit present.     Mental Status: He is alert.     Cranial Nerves: No cranial nerve deficit.     Motor: No weakness.    (all labs ordered are listed, but only abnormal results are displayed) Labs Reviewed  BASIC METABOLIC PANEL WITH GFR - Abnormal; Notable for the following components:      Result Value   Potassium 3.2 (*)    Glucose, Bld 110 (*)    BUN <5 (*)    All other components within normal limits  CBC  TROPONIN I (HIGH SENSITIVITY)  TROPONIN I (HIGH SENSITIVITY)    EKG: EKG Interpretation Date/Time:  Thursday December 26 2023 09:12:19 EDT Ventricular Rate:  87 PR Interval:  142 QRS Duration:  86 QT Interval:  354 QTC Calculation: 425 R Axis:   65  Text Interpretation: Normal sinus rhythm with sinus arrhythmia Normal ECG When compared with ECG of 22-May-2017 08:18, PREVIOUS ECG IS PRESENT Confirmed by Laurice Coy [phone]) on 12/26/2023 9:29:57 AM  Radiology: [facility] Chest 2 View Result Date: 12/26/2023 CLINICAL DATA:  Shortness of breath with left-sided chest pain/tightness for a few weeks. Intermittent cough. EXAM: CHEST - 2 VIEW COMPARISON:  Radiographs 05/22/2017.  FINDINGS: The heart size and mediastinal contours are normal. The lungs are clear. There is no pleural effusion or pneumothorax. No acute osseous findings are identified. IMPRESSION: No active cardiopulmonary process. Electronically Signed   By: Elsie Perone M.D.   On: 12/26/2023 09:44   Medications Ordered in the ED  potassium chloride  SA (KLOR-CON  M) CR tablet 40 mEq (40 mEq Oral Given 12/26/23 1200)    Medical Decision Making Amount and/or Complexity of Data Reviewed Labs: ordered. Radiology: ordered.  Risk Prescription drug management.   John Mata is a 43 y.o. male is here with chest pain.  Lab Tests:  CBC: WNL BMP: K 3.2 Troponin:  4 > 3  EKG: No signs of acute ischemia.   CXR: No acute findings.   Medicines ordered:  K 40mEq   Plan Workup today reassuring against acute cardiac or pulmonary etiology. CBC WNL. Patient given K supplement for mild hypokalemia, otherwise unremarkable chemistry. Overall suspect atypical chest pain with possible component of panic symptoms. Discussed and placed cardiology referral for further workup. Also discussed and provided [facility] information for routine care of other chronic issues. Patient remained stable for discharge.   Final diagnoses:  Chest pain, unspecified type    ED Discharge Orders          Ordered    Ambulatory referral to Cardiology       Comments: If you have not heard from the Cardiology office within the next 72 hours please call [phone].   12/26/23 1321               Diona Perkins, MD 12/26/23 1617    Laurice Maude BROCKS, MD 12/29/23 [phone]
